# Patient Record
Sex: Female | Born: 1935 | Hispanic: No | Smoking: Former smoker
Health system: Southern US, Community
[De-identification: ages and names within clinical notes are randomized; demographics above are authoritative.]

## PROBLEM LIST (undated history)

## (undated) DIAGNOSIS — R7303 Prediabetes: Secondary | ICD-10-CM

## (undated) DIAGNOSIS — J449 Chronic obstructive pulmonary disease, unspecified: Secondary | ICD-10-CM

## (undated) DIAGNOSIS — I4891 Unspecified atrial fibrillation: Secondary | ICD-10-CM

## (undated) DIAGNOSIS — I10 Essential (primary) hypertension: Secondary | ICD-10-CM

## (undated) DIAGNOSIS — I6930 Unspecified sequelae of cerebral infarction: Secondary | ICD-10-CM

## (undated) DIAGNOSIS — E7800 Pure hypercholesterolemia, unspecified: Secondary | ICD-10-CM

## (undated) DIAGNOSIS — Z8551 Personal history of malignant neoplasm of bladder: Secondary | ICD-10-CM

---
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
## 1966-01-23 HISTORY — PX: ABDOMINAL HYSTERECTOMY: SHX81

## 1994-01-23 HISTORY — PX: BLADDER REMOVAL: SHX567

## 2006-01-23 HISTORY — PX: REPLACEMENT TOTAL KNEE: SUR1224

## 2015-01-24 HISTORY — PX: CATARACT EXTRACTION: SUR2

## 2018-10-18 ENCOUNTER — Encounter: Payer: Self-pay | Admitting: Cardiology

## 2018-10-18 ENCOUNTER — Ambulatory Visit (INDEPENDENT_AMBULATORY_CARE_PROVIDER_SITE_OTHER): Payer: Medicare Other | Admitting: Cardiology

## 2018-10-18 ENCOUNTER — Other Ambulatory Visit: Payer: Self-pay

## 2018-10-18 VITALS — BP 140/60 | HR 60 | Ht 66.5 in | Wt 223.0 lb

## 2018-10-18 DIAGNOSIS — J449 Chronic obstructive pulmonary disease, unspecified: Secondary | ICD-10-CM

## 2018-10-18 DIAGNOSIS — E7800 Pure hypercholesterolemia, unspecified: Secondary | ICD-10-CM

## 2018-10-18 DIAGNOSIS — I6930 Unspecified sequelae of cerebral infarction: Secondary | ICD-10-CM | POA: Diagnosis not present

## 2018-10-18 DIAGNOSIS — I4891 Unspecified atrial fibrillation: Secondary | ICD-10-CM

## 2018-10-18 DIAGNOSIS — I480 Paroxysmal atrial fibrillation: Secondary | ICD-10-CM

## 2018-10-18 DIAGNOSIS — I10 Essential (primary) hypertension: Secondary | ICD-10-CM

## 2018-10-18 DIAGNOSIS — R7303 Prediabetes: Secondary | ICD-10-CM

## 2018-10-18 DIAGNOSIS — Z8551 Personal history of malignant neoplasm of bladder: Secondary | ICD-10-CM

## 2018-10-18 HISTORY — DX: Unspecified sequelae of cerebral infarction: I69.30

## 2018-10-18 HISTORY — DX: Pure hypercholesterolemia, unspecified: E78.00

## 2018-10-18 HISTORY — DX: Personal history of malignant neoplasm of bladder: Z85.51

## 2018-10-18 HISTORY — DX: Unspecified atrial fibrillation: I48.91

## 2018-10-18 HISTORY — DX: Essential (primary) hypertension: I10

## 2018-10-18 HISTORY — DX: Prediabetes: R73.03

## 2018-10-18 HISTORY — DX: Chronic obstructive pulmonary disease, unspecified: J44.9

## 2018-10-18 HISTORY — DX: Paroxysmal atrial fibrillation: I48.0

## 2018-10-18 NOTE — Patient Instructions (Signed)
Medication Instructions:  Your physician recommends that you continue on your current medications as directed. Please refer to the Current Medication list given to you today.  If you need a refill on your cardiac medications before your next appointment, please call your pharmacy.   Lab work: NONE If you have labs (blood work) drawn today and your tests are completely normal, you will receive your results only by: . MyChart Message (if you have MyChart) OR . A paper copy in the mail If you have any lab test that is abnormal or we need to change your treatment, we will call you to review the results.  Testing/Procedures: You had an EKG performed today  Follow-Up: At [facility], you and your health needs are our priority.  As part of our continuing mission to provide you with exceptional heart care, we have created designated Provider Care Teams.  These Care Teams include your primary Cardiologist (physician) and Advanced Practice Providers (APPs -  Physician Assistants and Nurse Practitioners) who all work together to provide you with the care you need, when you need it. You will need a follow up appointment in 6 months.   

## 2018-10-18 NOTE — Progress Notes (Signed)
Cardiology Office Note:    Date:  10/18/2018   ID:  Lisa Rangel, DOB December 03, 1923, MRN [id number]  PCP:  Leighton Ruff, MD  Cardiologist:  Jenean Lindau, MD   Referring MD: Leighton Ruff, MD    ASSESSMENT:    1. Paroxysmal atrial fibrillation (HCC)   2. History of stroke with residual deficit   3. Hypercholesterolemia    PLAN:    In order of problems listed above:  1. Paroxysmal atrial fibrillation: EKG done today reveals sinus rhythm and nonspecific ST-T changes.  I discussed with the patient atrial fibrillation, disease process. Management and therapy including rate and rhythm control, anticoagulation benefits and potential risks were discussed extensively with the patient. Patient had multiple questions which were answered to patient's satisfaction. 2. History of stroke: Managed appropriately.  On anticoagulation now 3. Mixed dyslipidemia: Lipids followed by primary care physician. 4. Pedal edema: Patient has significant concerns about pedal edema.  This is been longstanding.  DVT study in the past has been negative.  I reviewed those records.  In view of this I told the patient about my limitations for treating pedal edema.  She is already getting a diuretic dose.  She consumes excessive fluids because of her urostomy and I told her to talk to her urologist about controlling this and reducing fluid.  Also other measures such as keeping legs elevated especially when she is sleeping and compression stockings were explained to the patient and conservative measures are probably the way to go in view of her multiple comorbidities and advanced age.  She and her daughter had multiple questions which were answered to their satisfaction.Patient will be seen in follow-up appointment in 6 months or earlier if the patient has any concerns    Medication Adjustments/Labs and Tests Ordered: Current medicines are reviewed at length with the patient today.  Concerns regarding medicines are  outlined above.  No orders of the defined types were placed in this encounter.  No orders of the defined types were placed in this encounter.    History of Present Illness:    Lisa Rangel is a 85 y.o. female who is being seen today for the evaluation of paroxysmal atrial fibrillation at the request of Leighton Ruff, MD.  Patient is a pleasant 85 year old female.  She has past medical history of essential hypertension, paroxysmal atrial fibrillation and history of stroke.  Subsequently she has been on anticoagulation.  She was on anticoagulation before this but she interrupted this by herself.  She is brought in in a wheelchair by a very supportive daughter.  Patient denies any chest pain orthopnea or PND.  She has history of bladder cancer and has a urostomy.  At the time of my evaluation, the patient is alert awake oriented and in no distress.  Past Medical History:  Diagnosis Date  . Atrial fibrillation ([place]) 10/18/2018  . COPD (chronic obstructive pulmonary disease) ([place]) 10/18/2018  . History of bladder cancer 10/18/2018  . History of stroke with residual deficit 10/18/2018  . Hypercholesterolemia 10/18/2018  . Hypertension 10/18/2018  . Prediabetes 10/18/2018    Past Surgical History:  Procedure Laterality Date  . ABDOMINAL HYSTERECTOMY  1968  . APPENDECTOMY  1949  . BLADDER REMOVAL  1996  . CATARACT EXTRACTION  2017  . REPLACEMENT TOTAL KNEE Right 2008    Current Medications: Current Meds  Medication Sig  . albuterol (VENTOLIN HFA) 108 (90 Base) MCG/ACT inhaler Inhale 1 puff into the lungs every 4 (four) hours as needed for  wheezing or shortness of breath.  Marland Kitchen alum & mag hydroxide-simeth (MAALOX/MYLANTA) 200-200-20 MG/5ML suspension Take by mouth every 6 (six) hours as needed for indigestion or heartburn.  Ailene Ards ELLIPTA 62.5-25 MCG/INH AEPB INHALE 1 PUFF BY MOUTH QD AT THE SAME TIME EACH DAY  . apixaban (ELIQUIS) 5 MG TABS tablet Take 5 mg by mouth 2 (two) times daily.  .  Ascorbic Acid (VITA-C PO) Take 500 mg by mouth.   . beta carotene 75643 UNIT capsule Take 10,000 Units by mouth daily.  Marland Kitchen ezetimibe (ZETIA) 10 MG tablet 10 mg daily.  Marland Kitchen FOLIC ACID PO Take by mouth.  . furosemide (LASIX) 80 MG tablet Take 80 mg by mouth daily.  Marland Kitchen gabapentin (NEURONTIN) 100 MG capsule 100 mg 3 (three) times daily.   Marland Kitchen MAGNESIUM-ZINC PO Take by mouth.  . metoprolol succinate (TOPROL-XL) 25 MG 24 hr tablet Take 25 mg by mouth daily.  . Omega-3 Fatty Acids (FISH OIL) 500 MG CAPS Take by mouth 3 (three) times daily.  Marland Kitchen omeprazole (PRILOSEC) 20 MG capsule 20 mg daily.  . simvastatin (ZOCOR) 20 MG tablet 20 mg daily.     Allergies:   Celecoxib, Esomeprazole, and Lisinopril   Social History   Socioeconomic History  . Marital status: Widowed    Spouse name: Not on file  . Number of children: Not on file  . Years of education: Not on file  . Highest education level: Not on file  Occupational History  . Not on file  Social Needs  . Financial resource strain: Not on file  . Food insecurity    Worry: Not on file    Inability: Not on file  . Transportation needs    Medical: Not on file    Non-medical: Not on file  Tobacco Use  . Smoking status: Former Smoker    Types: Cigarettes    Quit date: 2007    Years since quitting: 13.7  . Smokeless tobacco: Never Used  Substance and Sexual Activity  . Alcohol use: Not on file  . Drug use: Not on file  . Sexual activity: Not on file  Lifestyle  . Physical activity    Days per week: Not on file    Minutes per session: Not on file  . Stress: Not on file  Relationships  . Social Musician on phone: Not on file    Gets together: Not on file    Attends religious service: Not on file    Active member of club or organization: Not on file    Attends meetings of clubs or organizations: Not on file    Relationship status: Not on file  Other Topics Concern  . Not on file  Social History Narrative  . Not on file      Family History: The patient's family history includes Bladder Cancer in her mother; Heart attack in her father.  ROS:   Please see the history of present illness.    All other systems reviewed and are negative.  EKGs/Labs/Other Studies Reviewed:    The following studies were reviewed today: Results of the echocardiogram and medical referring doctor's notes were reviewed extensively.   Recent Labs: No results found for requested labs within last 8760 hours.  Recent Lipid Panel No results found for: CHOL, TRIG, HDL, CHOLHDL, VLDL, LDLCALC, LDLDIRECT  Physical Exam:    VS:  BP 140/60 (BP Location: Left Arm, Patient Position: Sitting, Cuff Size: Normal)   Pulse 60  Ht 5' 6.5" (1.689 m)   Wt 223 lb (101.2 kg)   SpO2 94%   BMI 35.45 kg/m     Wt Readings from Last 3 Encounters:  10/18/18 223 lb (101.2 kg)     GEN: Patient is in no acute distress HEENT: Normal NECK: No JVD; No carotid bruits LYMPHATICS: No lymphadenopathy CARDIAC: S1 S2 regular, 2/6 systolic murmur at the apex. RESPIRATORY:  Clear to auscultation without rales, wheezing or rhonchi  ABDOMEN: Soft, non-tender, non-distended MUSCULOSKELETAL: 3+ edema; No deformity  SKIN: Warm and dry NEUROLOGIC:  Alert and oriented x 3 PSYCHIATRIC:  Normal affect    Signed, Garwin Brothersajan R Abrahim Sargent, MD  10/18/2018 2:10 PM    [facility]

## 2018-12-04 DIAGNOSIS — I872 Venous insufficiency (chronic) (peripheral): Secondary | ICD-10-CM | POA: Insufficient documentation

## 2018-12-04 DIAGNOSIS — Z7901 Long term (current) use of anticoagulants: Secondary | ICD-10-CM

## 2018-12-04 DIAGNOSIS — M7989 Other specified soft tissue disorders: Secondary | ICD-10-CM

## 2018-12-04 HISTORY — DX: Venous insufficiency (chronic) (peripheral): I87.2

## 2018-12-04 HISTORY — DX: Long term (current) use of anticoagulants: Z79.01

## 2018-12-04 HISTORY — DX: Other specified soft tissue disorders: M79.89

## 2019-01-07 ENCOUNTER — Other Ambulatory Visit: Payer: Self-pay | Admitting: *Deleted

## 2019-01-07 ENCOUNTER — Telehealth: Payer: Self-pay | Admitting: Cardiology

## 2019-01-07 MED ORDER — APIXABAN 5 MG PO TABS: 5.0000 mg | ORAL_TABLET | Freq: Two times a day (BID) | ORAL | 1 refills | Status: DC

## 2019-01-07 NOTE — Telephone Encounter (Signed)
Refill sent.

## 2019-01-07 NOTE — Telephone Encounter (Signed)
°*  STAT* If patient is at the pharmacy, call can be transferred to refill team.   1. Which medications need to be refilled? (please list name of each medication and dose if known) apixaban (ELIQUIS) 5 MG TABS   2. Which pharmacy/location (including street and city if local pharmacy) is medication to be sent to?  [id number] - [place], [place] - [address] AT [facility] [address] Phone:  [phone]  Fax:  [phone]      3. Do they need a 30 day or 90 day supply?    PATIENT WILL NOT HAVE PRESCRIPTION DRUG COVERAGE UNTIL 01/24/2019, SHE ONLY WANTS ENOUGH RIGHT NOW TO GET HER THROUGH THE END OF THE YEAR

## 2019-02-13 ENCOUNTER — Other Ambulatory Visit: Payer: Self-pay

## 2019-02-13 ENCOUNTER — Encounter (HOSPITAL_COMMUNITY): Payer: Self-pay | Admitting: Emergency Medicine

## 2019-02-13 ENCOUNTER — Emergency Department (HOSPITAL_COMMUNITY)
Admission: EM | Admit: 2019-02-13 | Discharge: 2019-02-13 | Disposition: A | Discharge status: Home / Self Care | Payer: Medicare Other | Attending: Emergency Medicine | Admitting: Emergency Medicine

## 2019-02-13 ENCOUNTER — Emergency Department (HOSPITAL_COMMUNITY): Payer: Medicare Other

## 2019-02-13 DIAGNOSIS — I10 Essential (primary) hypertension: Secondary | ICD-10-CM | POA: Diagnosis not present

## 2019-02-13 DIAGNOSIS — Z87891 Personal history of nicotine dependence: Secondary | ICD-10-CM | POA: Diagnosis not present

## 2019-02-13 DIAGNOSIS — Z7901 Long term (current) use of anticoagulants: Secondary | ICD-10-CM | POA: Diagnosis not present

## 2019-02-13 DIAGNOSIS — Z79899 Other long term (current) drug therapy: Secondary | ICD-10-CM | POA: Insufficient documentation

## 2019-02-13 DIAGNOSIS — W1830XA Fall on same level, unspecified, initial encounter: Secondary | ICD-10-CM | POA: Diagnosis not present

## 2019-02-13 DIAGNOSIS — Y92019 Unspecified place in single-family (private) house as the place of occurrence of the external cause: Secondary | ICD-10-CM | POA: Insufficient documentation

## 2019-02-13 DIAGNOSIS — Y999 Unspecified external cause status: Secondary | ICD-10-CM | POA: Diagnosis not present

## 2019-02-13 DIAGNOSIS — J449 Chronic obstructive pulmonary disease, unspecified: Secondary | ICD-10-CM | POA: Diagnosis not present

## 2019-02-13 DIAGNOSIS — I4891 Unspecified atrial fibrillation: Secondary | ICD-10-CM | POA: Diagnosis not present

## 2019-02-13 DIAGNOSIS — Y929 Unspecified place or not applicable: Secondary | ICD-10-CM | POA: Insufficient documentation

## 2019-02-13 DIAGNOSIS — S0990XA Unspecified injury of head, initial encounter: Secondary | ICD-10-CM | POA: Diagnosis present

## 2019-02-13 DIAGNOSIS — Y9389 Activity, other specified: Secondary | ICD-10-CM | POA: Insufficient documentation

## 2019-02-13 DIAGNOSIS — W19XXXA Unspecified fall, initial encounter: Secondary | ICD-10-CM

## 2019-02-13 DIAGNOSIS — Y92009 Unspecified place in unspecified non-institutional (private) residence as the place of occurrence of the external cause: Secondary | ICD-10-CM

## 2019-02-13 NOTE — ED Provider Notes (Signed)
[facility] EMERGENCY DEPARTMENT Provider Note   CSN: [id number] Arrival date & time: 02/13/19  1629     History Chief Complaint  Patient presents with  . Fall    Lisa Rangel is a 86 y.o. female.  86 year old female with prior medical history as detailed below presents for evaluation following fall.  Patient reportedly was at home when she had a fall from standing.  She reportedly was reaching over to pick up a book when she lost her balance and fell.  She struck her left temple.  She did not have loss conscious.  She denies neck pain.  She denies other injury related to the fall.  She is currently taking Eliquis.  She reports full compliance with all her medications.  She uses a walker at baseline for ambulation assistance.  Patient was transported by EMS for evaluation given use of Eliquis and concurrent head injury.   Patient is currently without significant complaint.  The history is provided by the patient and medical records.  Fall This is a new problem. The current episode started 1 to 2 hours ago. The problem occurs rarely. The problem has not changed since onset.Pertinent negatives include no chest pain, no abdominal pain, no headaches and no shortness of breath. Nothing aggravates the symptoms. Nothing relieves the symptoms.       Past Medical History:  Diagnosis Date  . Atrial fibrillation (HCC) 10/18/2018  . COPD (chronic obstructive pulmonary disease) (HCC) 10/18/2018  . History of bladder cancer 10/18/2018  . History of stroke with residual deficit 10/18/2018  . Hypercholesterolemia 10/18/2018  . Hypertension 10/18/2018  . Prediabetes 10/18/2018    Patient Active Problem List   Diagnosis Date Noted  . Prediabetes 10/18/2018  . Hypercholesterolemia 10/18/2018  . COPD (chronic obstructive pulmonary disease) (HCC) 10/18/2018  . History of bladder cancer 10/18/2018  . History of stroke with residual deficit 10/18/2018  . Paroxysmal atrial  fibrillation (HCC) 10/18/2018    Past Surgical History:  Procedure Laterality Date  . ABDOMINAL HYSTERECTOMY  1968  . APPENDECTOMY  1949  . BLADDER REMOVAL  1996  . CATARACT EXTRACTION  2017  . REPLACEMENT TOTAL KNEE Right 2008     OB History   No obstetric history on file.     Family History  Problem Relation Age of Onset  . Bladder Cancer Mother   . Heart attack Father     Social History   Tobacco Use  . Smoking status: Former Smoker    Types: Cigarettes    Quit date: 2007    Years since quitting: 14.0  . Smokeless tobacco: Never Used  Substance Use Topics  . Alcohol use: Not on file  . Drug use: Not on file    Home Medications Prior to Admission medications   Medication Sig Start Date End Date Taking? Authorizing Provider  albuterol (VENTOLIN HFA) 108 (90 Base) MCG/ACT inhaler Inhale 1 puff into the lungs every 4 (four) hours as needed for wheezing or shortness of breath.    [provider]  alum & mag hydroxide-simeth (MAALOX/MYLANTA) 200-200-20 MG/5ML suspension Take by mouth every 6 (six) hours as needed for indigestion or heartburn.    [provider]  ANORO ELLIPTA 62.5-25 MCG/INH AEPB INHALE 1 PUFF BY MOUTH QD AT THE SAME TIME EACH DAY 10/01/18   [provider]  apixaban (ELIQUIS) 5 MG TABS tablet Take 1 tablet (5 mg total) by mouth 2 (two) times daily. 01/07/19   Revankar, Aundra Dubin, MD  Ascorbic Acid (VITA-C PO) Take 500 mg by mouth.     [provider]  beta carotene 95188 UNIT capsule Take 10,000 Units by mouth daily.    [provider]  ezetimibe (ZETIA) 10 MG tablet 10 mg daily. 09/13/18   [provider]  FOLIC ACID PO Take by mouth.    [provider]  furosemide (LASIX) 80 MG tablet Take 80 mg by mouth daily.    [provider]  gabapentin (NEURONTIN) 100 MG capsule 100 mg 3 (three) times daily.  10/02/18   [provider]  MAGNESIUM-ZINC PO Take by mouth.    [provider]  metoprolol succinate (TOPROL-XL) 25 MG 24 hr tablet Take 25 mg by mouth daily.    [provider]  Omega-3 Fatty Acids (FISH OIL) 500 MG CAPS Take by mouth 3 (three) times daily.    [provider]  omeprazole (PRILOSEC) 20 MG capsule 20 mg daily. 09/13/18   [provider]  simvastatin (ZOCOR) 20 MG tablet 20 mg daily. 10/02/18   [provider]    Allergies    Celecoxib, Esomeprazole, and Lisinopril  Review of Systems   Review of Systems  Respiratory: Negative for shortness of breath.   Cardiovascular: Negative for chest pain.  Gastrointestinal: Negative for abdominal pain.  Neurological: Negative for headaches.  All other systems reviewed and are negative.   Physical Exam Updated Vital Signs There were no vitals taken for this visit.  Physical Exam Vitals and nursing note reviewed.  Constitutional:      General: She is not in acute distress.    Appearance: She is well-developed.  HENT:     Head: Normocephalic and atraumatic.  Eyes:     Conjunctiva/sclera: Conjunctivae normal.     Pupils: Pupils are equal, round, and reactive to light.  Cardiovascular:     Rate and Rhythm: Normal rate and regular rhythm.     Heart sounds: Normal heart sounds.  Pulmonary:     Effort: Pulmonary effort is normal. No respiratory distress.     Breath sounds: Normal breath sounds.  Abdominal:     General: There is no distension.     Palpations: Abdomen is soft.     Tenderness: There is no abdominal tenderness.  Musculoskeletal:        General: No deformity. Normal range of motion.     Cervical back: Normal range of motion and neck supple.  Skin:    General: Skin is warm and dry.  Neurological:     General: No focal deficit present.     Mental Status: She is alert and oriented to person, place, and time. Mental status is at baseline.     Comments: GCS 15  Normal Speech  No facial droop  5/5 strength to all 4 extremities      ED  Results / Procedures / Treatments   Labs (all labs ordered are listed, but only abnormal results are displayed) Labs Reviewed - No data to display  EKG None  Radiology CT Head Wo Contrast  Result Date: 02/13/2019 CLINICAL DATA:  86 year old female with fall. EXAM: CT HEAD WITHOUT CONTRAST TECHNIQUE: Contiguous axial images were obtained from the base of the skull through the vertex without intravenous contrast. COMPARISON:  None. FINDINGS: Brain: There is mild age-related atrophy and chronic microvascular ischemic changes. Right thalamic old lacunar infarct. There is no acute intracranial hemorrhage. No mass effect or midline shift. No extra-axial fluid collection. Vascular: No hyperdense vessel or unexpected  calcification. Skull: Normal. Negative for fracture or focal lesion. Sinuses/Orbits: No acute finding. Other: None IMPRESSION: 1. No acute intracranial hemorrhage. 2. Age-related atrophy and chronic microvascular ischemic changes. Old right thalamic lacunar infarct. Electronically Signed   By: Anner Crete M.D.   On: 02/13/2019 19:11    Procedures Procedures (including critical care time)  Medications Ordered in ED Medications - No data to display  ED Course  I have reviewed the triage vital signs and the nursing notes.  Pertinent labs & imaging results that were available during my care of the patient were reviewed by me and considered in my medical decision making (see chart for details).    MDM Rules/Calculators/A&P                      MDM  Screen complete  Yenty Bloch was evaluated in Emergency Department on 02/13/2019 for the symptoms described in the history of present illness. She was evaluated in the context of the global COVID-19 pandemic, which necessitated consideration that the patient might be at risk for infection with the SARS-CoV-2 virus that causes COVID-19. Institutional protocols and algorithms that pertain to the evaluation of patients at risk for  COVID-19 are in a state of rapid change based on information released by regulatory bodies including the CDC and federal and state organizations. These policies and algorithms were followed during the patient's care in the ED.  Patient is presenting for evaluation following reported fall at home.  The cause of her fall was a loss of balance while attempting to reach for an object on the floor.  She is without evidence of significant traumatic injury on exam.  CT head does not reveal significant intracranial injury.  Patient does desire discharge home following her evaluation and treatment in the ED.  Family (daughter) who was present at the bedside is aware of the need for close follow-up.  Strict return precautions given and understood.      Final Clinical Impression(s) / ED Diagnoses Final diagnoses:  Injury of head, initial encounter  Fall in home, initial encounter    Rx / DC Orders ED Discharge Orders    None       Valarie Merino, MD 02/13/19 [place]

## 2019-02-13 NOTE — Discharge Instructions (Addendum)
Please return for any problem.  Follow-up with your regular care provider as instructed. °

## 2019-02-13 NOTE — ED Triage Notes (Signed)
BIB EMS from home after witnessed fall. Pt was bending over picking something up when she lost her balance and fell. Hit head. Takes thinners. Hx stroke with L sided deficits. VSS.

## 2019-02-21 ENCOUNTER — Encounter: Payer: Self-pay | Admitting: Podiatry

## 2019-02-21 ENCOUNTER — Ambulatory Visit: Payer: Medicare Other | Admitting: Podiatry

## 2019-02-21 ENCOUNTER — Other Ambulatory Visit: Payer: Self-pay

## 2019-02-21 VITALS — BP 163/92 | HR 60 | Temp 97.9°F | Resp 16

## 2019-02-21 DIAGNOSIS — L03031 Cellulitis of right toe: Secondary | ICD-10-CM | POA: Diagnosis not present

## 2019-02-21 DIAGNOSIS — M79675 Pain in left toe(s): Secondary | ICD-10-CM

## 2019-02-21 DIAGNOSIS — B351 Tinea unguium: Secondary | ICD-10-CM

## 2019-02-21 DIAGNOSIS — M79674 Pain in right toe(s): Secondary | ICD-10-CM | POA: Diagnosis not present

## 2019-02-21 NOTE — Progress Notes (Signed)
   Subjective:    Patient ID: Lisa Rangel, female    DOB: 1924/01/01, 86 y.o.   MRN: [id number]  HPI    Review of Systems  All other systems reviewed and are negative.      Objective:   Physical Exam        Assessment & Plan:

## 2019-02-21 NOTE — Addendum Note (Signed)
Addended by: Fritz Pickerel A on: 02/21/2019 03:43 PM   Modules accepted: Orders

## 2019-02-21 NOTE — Progress Notes (Signed)
Subjective:   Patient ID: Lisa Rangel, female   DOB: 86 y.o.   MRN: [id number]   HPI Patient presents with caregiver with patient having nail disease 1-5 bilateral and irritation of the right hallux medial side that sore and making it hard to wear shoe gear without difficulty.  They have noticed a small amount of drainage that is local to this area and patient is not completely lucid and does not smoke and is not active   Review of Systems  All other systems reviewed and are negative.       Objective:  Physical Exam Vitals and nursing note reviewed.  Constitutional:      Appearance: She is well-developed.  Pulmonary:     Effort: Pulmonary effort is normal.  Musculoskeletal:        General: Normal range of motion.  Skin:    General: Skin is warm.  Neurological:     Mental Status: She is alert.     Neurovascular status was found to be intact with patient noted to have diminishment sharp dull vibratory and range of motion.  She has an incurvated right hallux medial border with slight distal redness localized and has thick yellow brittle nailbeds 1-5 both feet that her or caregiver cannot cut.  Patient has decent digital perfusion and is not well oriented currently cannot hear well     Assessment:  Paronychia infection of the right hallux medial border localized with mycotic nail infection 1-5 both feet that are painful H&P     Plan:  He all conditions reviewed and I recommended correction of the nailbed and I infiltrated the right hallux 60 mg like Marcaine mixture sterile prep done and using sterile instrumentation I removed medial border removed proud flesh abscess tissue allow channel for drainage and I then debrided nailbeds 1-5 both feet with no iatrogenic bleeding noted

## 2019-03-12 DIAGNOSIS — H903 Sensorineural hearing loss, bilateral: Secondary | ICD-10-CM

## 2019-03-12 HISTORY — DX: Sensorineural hearing loss, bilateral: H90.3

## 2019-05-07 ENCOUNTER — Telehealth: Payer: Self-pay | Admitting: Cardiology

## 2019-05-07 NOTE — Telephone Encounter (Signed)
Called, LVM advising it was okay for daughter to accompany, patient is 86 years old, has issues with hearing, and daughter goes to all appointments with her.   Will route to make aware- if anything changed I advised we would call back.  Thank you!

## 2019-05-07 NOTE — Telephone Encounter (Signed)
New Message    Pts daughter says she will need to assist the pt to her appt because she is hard of hearing and comes to all of her appts    Please advise

## 2019-05-13 ENCOUNTER — Other Ambulatory Visit: Payer: Self-pay | Admitting: Cardiology

## 2019-05-13 NOTE — Telephone Encounter (Signed)
LOV with Dr. Tomie China 10/18/2018

## 2019-05-27 ENCOUNTER — Ambulatory Visit: Payer: Medicare Other | Admitting: Podiatry

## 2019-05-28 ENCOUNTER — Other Ambulatory Visit: Payer: Self-pay

## 2019-05-28 ENCOUNTER — Encounter: Payer: Self-pay | Admitting: Cardiology

## 2019-05-28 ENCOUNTER — Ambulatory Visit: Payer: Medicare Other | Admitting: Cardiology

## 2019-05-28 VITALS — BP 140/80 | HR 110 | Temp 97.0°F | Resp 20 | Ht 66.0 in | Wt 221.2 lb

## 2019-05-28 DIAGNOSIS — R7303 Prediabetes: Secondary | ICD-10-CM | POA: Diagnosis not present

## 2019-05-28 DIAGNOSIS — I480 Paroxysmal atrial fibrillation: Secondary | ICD-10-CM

## 2019-05-28 DIAGNOSIS — E7800 Pure hypercholesterolemia, unspecified: Secondary | ICD-10-CM | POA: Diagnosis not present

## 2019-05-28 DIAGNOSIS — I6930 Unspecified sequelae of cerebral infarction: Secondary | ICD-10-CM

## 2019-05-28 NOTE — Patient Instructions (Signed)
Medication Instructions:  No medication changes *If you need a refill on your cardiac medications before your next appointment, please call your pharmacy*   Lab Work: Your physician recommends that you have a BMET and Hgb A1C today in the office.  If you have labs (blood work) drawn today and your tests are completely normal, you will receive your results only by: Marland Kitchen MyChart Message (if you have MyChart) OR . A paper copy in the mail If you have any lab test that is abnormal or we need to change your treatment, we will call you to review the results.   Testing/Procedures: None ordered   Follow-Up: At [facility], you and your health needs are our priority.  As part of our continuing mission to provide you with exceptional heart care, we have created designated Provider Care Teams.  These Care Teams include your primary Cardiologist (physician) and Advanced Practice Providers (APPs -  Physician Assistants and Nurse Practitioners) who all work together to provide you with the care you need, when you need it.  We recommend signing up for the patient portal called "MyChart".  Sign up information is provided on this After Visit Summary.  MyChart is used to connect with patients for Virtual Visits (Telemedicine).  Patients are able to view lab/test results, encounter notes, upcoming appointments, etc.  Non-urgent messages can be sent to your provider as well.   To learn more about what you can do with MyChart, go to [email or web address].    Your next appointment:   6 month(s)  The format for your next appointment:   In Person  Provider:   Belva Crome, MD   Other Instructions NA

## 2019-05-28 NOTE — Progress Notes (Signed)
Cardiology Office Note:    Date:  05/28/2019   ID:  Lisa Rangel, DOB 1923-06-17, MRN [id number]  PCP:  Leighton Ruff, MD  Cardiologist:  Jenean Lindau, MD   Referring MD: Leighton Ruff, MD    ASSESSMENT:    1. Hypercholesterolemia   2. Paroxysmal atrial fibrillation (HCC)   3. History of stroke with residual deficit   4. Prediabetes    PLAN:    In order of problems listed above:  1. Primary prevention stressed with the patient.  Importance of compliance with diet and medication stressed and she vocalized understanding. 2. Paroxysmal atrial fibrillation:I discussed with the patient atrial fibrillation, disease process. Management and therapy including rate and rhythm control, anticoagulation benefits and potential risks were discussed extensively with the patient. Patient had multiple questions which were answered to patient's satisfaction. 3. Her hemoglobin A1c is elevated.  I suspect she is now diabetic.  Daughter requests hemoglobin A1c check.  I told him that the results need to be discussed with her primary care physician and she is agreeable. 4. History of dyslipidemia: Lipids reviewed from February and they are unremarkable. 5. Patient will be seen in follow-up appointment in 6 months or earlier if the patient has any concerns    Medication Adjustments/Labs and Tests Ordered: Current medicines are reviewed at length with the patient today.  Concerns regarding medicines are outlined above.  No orders of the defined types were placed in this encounter.  No orders of the defined types were placed in this encounter.    Chief Complaint  Patient presents with  . Follow-up    No c/o     History of Present Illness:    Lisa Rangel is a 86 y.o. female.  Patient has past medical history approximately fibrillation, essential hypertension dyslipidemia and history of bladder cancer.  Her hemoglobin A1c was greater than 7 last time.  She denies any chest pain orthopnea  or PND.  Her daughter is very supportive.  At the time of my evaluation, the patient is alert awake oriented and in no distress.  Past Medical History:  Diagnosis Date  . Anticoagulation adequate 12/04/2018  . Atrial fibrillation ([place]) 10/18/2018  . Chronic venous insufficiency 12/04/2018  . COPD (chronic obstructive pulmonary disease) ([place]) 10/18/2018  . History of bladder cancer 10/18/2018  . History of stroke with residual deficit 10/18/2018  . Hypercholesterolemia 10/18/2018  . Hypertension 10/18/2018  . Paroxysmal atrial fibrillation ([place]) 10/18/2018  . Prediabetes 10/18/2018  . Sensorineural hearing loss (SNHL) of both ears 03/12/2019   Last Assessment & Plan:  Formatting of this note might be different from the original. Concern over hearing loss. Bilateral slowly progressive hearing loss that is become very frustrating.  Monica found bilateral cerumen impaction and cleaned her ears. EXAMINATION reveals normal external canals and tympanic membranes. AUDIOGRAM shows moderate to severe sensorineural hearing loss bilaterally and sy  . Swollen leg 12/04/2018    Past Surgical History:  Procedure Laterality Date  . ABDOMINAL HYSTERECTOMY  1968  . APPENDECTOMY  1949  . BLADDER REMOVAL  1996  . CATARACT EXTRACTION  2017  . REPLACEMENT TOTAL KNEE Right 2008    Current Medications: Current Meds  Medication Sig  . acetaminophen (TYLENOL) 500 MG tablet Take 500 mg by mouth every 6 (six) hours as needed.  Marland Kitchen albuterol (VENTOLIN HFA) 108 (90 Base) MCG/ACT inhaler Inhale 1 puff into the lungs every 4 (four) hours as needed for wheezing or shortness of breath.  Marland Kitchen alum & mag  hydroxide-simeth (MAALOX/MYLANTA) 200-200-20 MG/5ML suspension Take by mouth every 6 (six) hours as needed for indigestion or heartburn.  Ailene Ards ELLIPTA 62.5-25 MCG/INH AEPB INHALE 1 PUFF BY MOUTH QD AT THE SAME TIME EACH DAY  . Ascorbic Acid (VITA-C PO) Take 500 mg by mouth.   . beta carotene 35456 UNIT capsule Take 10,000  Units by mouth daily.  . calcium citrate-vitamin D (CITRACAL+D) 315-200 MG-UNIT tablet Take 1 tablet by mouth 2 (two) times daily.  Marland Kitchen ELIQUIS 5 MG TABS tablet TAKE 1 TABLET(5 MG) BY MOUTH TWICE DAILY  . ezetimibe (ZETIA) 10 MG tablet 10 mg daily.  Marland Kitchen FOLIC ACID PO Take by mouth.  . furosemide (LASIX) 80 MG tablet Take 80 mg by mouth daily.  Marland Kitchen gabapentin (NEURONTIN) 100 MG capsule 100 mg 3 (three) times daily.   Marland Kitchen MAGNESIUM-ZINC PO Take by mouth.  . methenamine (HIPREX) 1 g tablet Take 1 g by mouth 2 (two) times daily with a meal. Antibiotic - treats bladder and kidney infections  . metoprolol tartrate (LOPRESSOR) 100 MG tablet Take 100 mg by mouth daily. Beta Blocker - treat HBP; chest pain; heart failure  . Omega-3 Fatty Acids (FISH OIL) 500 MG CAPS Take 1,000 mg by mouth 3 (three) times daily.   Marland Kitchen omeprazole (PRILOSEC) 20 MG capsule 20 mg daily.  . simvastatin (ZOCOR) 20 MG tablet 20 mg daily.  Marland Kitchen UNABLE TO FIND Antacid Suspension 200-200-20 mg/5 mL/30 mL by mouth  Take every 8 hours as needed     Allergies:   Celecoxib, Esomeprazole, and Lisinopril   Social History   Socioeconomic History  . Marital status: Widowed    Spouse name: Not on file  . Number of children: Not on file  . Years of education: Not on file  . Highest education level: Not on file  Occupational History  . Not on file  Tobacco Use  . Smoking status: Former Smoker    Types: Cigarettes    Quit date: 2007    Years since quitting: 14.3  . Smokeless tobacco: Never Used  Substance and Sexual Activity  . Alcohol use: Not on file  . Drug use: Not on file  . Sexual activity: Not on file  Other Topics Concern  . Not on file  Social History Narrative  . Not on file   Social Determinants of Health   Financial Resource Strain:   . Difficulty of Paying Living Expenses:   Food Insecurity:   . Worried About Programme researcher, broadcasting/film/video in the Last Year:   . Barista in the Last Year:   Transportation Needs:   .  Freight forwarder (Medical):   Marland Kitchen Lack of Transportation (Non-Medical):   Physical Activity:   . Days of Exercise per Week:   . Minutes of Exercise per Session:   Stress:   . Feeling of Stress :   Social Connections:   . Frequency of Communication with Friends and Family:   . Frequency of Social Gatherings with Friends and Family:   . Attends Religious Services:   . Active Member of Clubs or Organizations:   . Attends Banker Meetings:   Marland Kitchen Marital Status:      Family History: The patient's family history includes Bladder Cancer in her mother; Heart attack in her father.  ROS:   Please see the history of present illness.    All other systems reviewed and are negative.  EKGs/Labs/Other Studies Reviewed:    The following studies  were reviewed today: I discussed my findings with the patient at extensive length   Recent Labs: No results found for requested labs within last 8760 hours.  Recent Lipid Panel No results found for: CHOL, TRIG, HDL, CHOLHDL, VLDL, LDLCALC, LDLDIRECT  Physical Exam:    VS:  BP 140/80 (BP Location: Left Arm, Patient Position: Sitting, Cuff Size: Normal)   Pulse (!) 110   Temp (!) 97 F (36.1 C)   Resp 20   Ht 5\' 6"  (1.676 m)   Wt 221 lb 4 oz (100.4 kg)   SpO2 97%   BMI 35.71 kg/m     Wt Readings from Last 3 Encounters:  05/28/19 221 lb 4 oz (100.4 kg)  02/13/19 212 lb (96.2 kg)  10/18/18 223 lb (101.2 kg)     GEN: Patient is in no acute distress HEENT: Normal NECK: No JVD; No carotid bruits LYMPHATICS: No lymphadenopathy CARDIAC: Hear sounds regular, 2/6 systolic murmur at the apex. RESPIRATORY:  Clear to auscultation without rales, wheezing or rhonchi  ABDOMEN: Soft, non-tender, non-distended MUSCULOSKELETAL:  No edema; No deformity  SKIN: Warm and dry NEUROLOGIC:  Alert and oriented x 3 PSYCHIATRIC:  Normal affect   Signed, 10/20/18, MD  05/28/2019 8:50 AM    [facility]

## 2019-05-29 LAB — BASIC METABOLIC PANEL
BUN/Creatinine Ratio: 18 (ref 12–28)
BUN: 18 mg/dL (ref 10–36)
CO2: 20 mmol/L (ref 20–29)
Calcium: 10.4 mg/dL — ABNORMAL HIGH (ref 8.7–10.3)
Chloride: 102 mmol/L (ref 96–106)
Creatinine, Ser: 1 mg/dL (ref 0.57–1.00)
GFR calc Af Amer: 56 mL/min/{1.73_m2} — ABNORMAL LOW (ref >59)
GFR calc non Af Amer: 48 mL/min/{1.73_m2} — ABNORMAL LOW (ref >59)
Glucose: 145 mg/dL — ABNORMAL HIGH (ref 65–99)
Potassium: 4.2 mmol/L (ref 3.5–5.2)
Sodium: 141 mmol/L (ref 134–144)

## 2019-05-29 LAB — HEMOGLOBIN A1C
Est. average glucose Bld gHb Est-mCnc: 154 mg/dL
Hgb A1c MFr Bld: 7 % — ABNORMAL HIGH (ref 4.8–5.6)

## 2019-05-30 ENCOUNTER — Telehealth: Payer: Self-pay | Admitting: Cardiology

## 2019-05-30 NOTE — Telephone Encounter (Signed)
Patient's daughter returning call for lab results. Transferred call to the nurse.

## 2019-07-07 ENCOUNTER — Other Ambulatory Visit: Payer: Self-pay | Admitting: Cardiology

## 2019-10-02 ENCOUNTER — Other Ambulatory Visit: Payer: Self-pay | Admitting: Cardiology

## 2019-12-11 ENCOUNTER — Telehealth: Payer: Self-pay | Admitting: Physician Assistant

## 2019-12-11 NOTE — Telephone Encounter (Signed)
I connected by phone with Lisa Rangel and/or patient's caregiver on 12/11/2019 at 4:47 PM to discuss the potential vaccination through our Homebound vaccination initiative.   Prevaccination Checklist for COVID-19 Vaccines  1.  Are you feeling sick today? no  2.  Have you ever received a dose of a COVID-19 vaccine?  yes      If yes, which one? Moderna   3.  Have you ever had an allergic reaction: (This would include a severe reaction [ e.g., anaphylaxis] that required treatment with epinephrine or EpiPen or that caused you to go to the hospital.  It would also include an allergic reaction that occurred within 4 hours that caused hives, swelling, or respiratory distress, including wheezing.) A.  A previous dose of COVID-19 vaccine. no  B.  A vaccine or injectable therapy that contains multiple components, one of which is a COVID-19 vaccine component, but it is not known which component elicited the immediate reaction. no  C.  Are you allergic to polyethylene glycol? no  D. Are you allergic to Polysorbate, which is found in some vaccines, film coated tablets and intravenous steroids?  no   4.  Have you ever had an allergic reaction to another vaccine (other than COVID-19 vaccine) or an injectable medication? (This would include a severe reaction [ e.g., anaphylaxis] that required treatment with epinephrine or EpiPen or that caused you to go to the hospital.  It would also include an allergic reaction that occurred within 4 hours that caused hives, swelling, or respiratory distress, including wheezing.)  no   5.  Have you ever had a severe allergic reaction (e.g., anaphylaxis) to something other than a component of the COVID-19 vaccine, or any vaccine or injectable medication?  This would include food, pet, venom, environmental, or oral medication allergies.  no   6.  Have you received any vaccine in the last 14 days? no   7.  Have you ever had a positive test for COVID-19 or has a doctor ever told  you that you had COVID-19?  no   8.  Have you received passive antibody therapy (monoclonal antibodies or convalescent serum) as a treatment for COVID-19? no   9.  Do you have a weakened immune system caused by something such as HIV infection or cancer or do you take immunosuppressive drugs or therapies?  no   10.  Do you have a bleeding disorder or are you taking a blood thinner? yes   11.  Are you pregnant or breast-feeding? no   12.  Do you have dermal fillers? no   __________________   This patient is a 86 y.o. female that meets the FDA criteria to receive homebound vaccination. Patient or parent/caregiver understands they have the option to accept or refuse homebound vaccination.  Patient passed the pre-screening checklist and would like to proceed with homebound vaccination.  Based on questionnaire above, I recommend the patient be observed for 30 minutes.  There are an estimated 1 other household members/caregivers who are also interested in receiving the vaccine. Daughter potentially interested too.    I will send the patient's information to our scheduling team who will reach out to schedule the patient and potential caregiver/family members for homebound vaccination.    [name] 12/11/2019 4:47 PM

## 2019-12-24 ENCOUNTER — Ambulatory Visit: Payer: Medicare Other | Attending: Internal Medicine

## 2019-12-24 DIAGNOSIS — Z23 Encounter for immunization: Secondary | ICD-10-CM

## 2019-12-24 NOTE — Progress Notes (Signed)
° °  Covid-19 Vaccination Clinic  Name:  Lisa Rangel    MRN: [id number] DOB: 1923/08/05  12/24/2019  [name] was observed post Covid-19 immunization for 15 minutes without incident. She was provided with Vaccine Information Sheet and instruction to access the V-Safe system.   [name] was instructed to call 911 with any severe reactions post vaccine:  Difficulty breathing   Swelling of face and throat   A fast heartbeat   A bad rash all over body   Dizziness and weakness   Immunizations Administered    No immunizations on file.

## 2019-12-30 ENCOUNTER — Telehealth: Payer: Self-pay | Admitting: Cardiology

## 2019-12-30 NOTE — Telephone Encounter (Signed)
New Message"      Pt has a yeast infection. Daughter wants to know if it is alright for pt to use Diflucan along with pt's Eliquis?r

## 2019-12-31 NOTE — Telephone Encounter (Signed)
Advised pt's daughter to call her pharmacist as they are the best to ask for medication interactions. She verbalized understanding and had no additional questions.

## 2020-02-16 ENCOUNTER — Ambulatory Visit: Payer: Medicare Other | Admitting: Podiatry

## 2020-03-18 ENCOUNTER — Ambulatory Visit: Payer: Medicare Other | Admitting: Podiatry

## 2020-03-18 ENCOUNTER — Encounter: Payer: Self-pay | Admitting: Podiatry

## 2020-03-18 ENCOUNTER — Other Ambulatory Visit: Payer: Self-pay

## 2020-03-18 DIAGNOSIS — M79674 Pain in right toe(s): Secondary | ICD-10-CM | POA: Diagnosis not present

## 2020-03-18 DIAGNOSIS — B351 Tinea unguium: Secondary | ICD-10-CM

## 2020-03-18 DIAGNOSIS — M79675 Pain in left toe(s): Secondary | ICD-10-CM | POA: Diagnosis not present

## 2020-03-18 NOTE — Progress Notes (Signed)
Subjective:   Patient ID: Lisa Rangel, female   DOB: 87 y.o.   MRN: [id number]   HPI Patient presents stating with caregiver that her nails are so thick and impossible to cut and they have tried without relief stating that they become very tender   ROS      Objective:  Physical Exam  Neurovascular status unchanged patient in wheelchair currently with thick yellow brittle nailbeds 1-5 both feet painful     Assessment:  Mycotic nail infection 1-5 both feet chronic     Plan:  Debridement of nailbeds 1-5 both feet no iatrogenic bleeding noted

## 2020-07-21 ENCOUNTER — Other Ambulatory Visit: Payer: Self-pay | Admitting: Cardiology

## 2020-07-21 NOTE — Telephone Encounter (Signed)
30f, 100.4kg, scr 0.88 03/02/20, lovw/revankar 05/28/19 OVERDUE

## 2020-08-02 ENCOUNTER — Ambulatory Visit: Payer: Medicare Other | Attending: Critical Care Medicine

## 2020-08-02 DIAGNOSIS — Z23 Encounter for immunization: Secondary | ICD-10-CM

## 2020-08-02 NOTE — Progress Notes (Signed)
   Covid-19 Vaccination Clinic  Name:  Karis Rilling    MRN: [id number] DOB: 16-Jul-1923  08/02/2020  Ms. Keesey was observed post Covid-19 immunization for 15 minutes without incident. She was provided with Vaccine Information Sheet and instruction to access the V-Safe system.   Ms. Strathman was instructed to call 911 with any severe reactions post vaccine: Difficulty breathing  Swelling of face and throat  A fast heartbeat  A bad rash all over body  Dizziness and weakness   Immunizations Administered     Name Date Dose VIS Date Route   Moderna Covid-19 Booster Vaccine 08/02/2020 11:50 AM 0.25 mL 11/12/2019 Intramuscular   Manufacturer: Moderna   Lot: [id number]   NDC: 37342-876-81

## 2020-11-02 ENCOUNTER — Telehealth: Payer: Self-pay | Admitting: Cardiology

## 2020-11-02 NOTE — Telephone Encounter (Signed)
Patient c/o Palpitations:  High priority if patient c/o lightheadedness, shortness of breath, or chest pain  How Bernhart have you had palpitations/irregular HR/ Afib? Are you having the symptoms now?  Patient's daughter states the patient has been in afib on and off for the past week. Last week PT went to see her and notified them that she was in afib, although she was asymptomatic  Are you currently experiencing lightheadedness, SOB or CP?  No   Do you have a history of afib (atrial fibrillation) or irregular heart rhythm?  Yes   Have you checked your BP or HR? (document readings if available):  111/76  83 Per patient's daughter, yesterday HR ranged 122-187  Are you experiencing any other symptoms?  No, patient's daughter states the patient is asymptomatic

## 2020-11-02 NOTE — Telephone Encounter (Signed)
Appointment offered in [place] 10/12 but pt's daughter declined. Appointment made for 10/27 as pt has to have transportation arrangements. Pt's daughter will go to the ED or callback if changes.

## 2020-11-17 NOTE — Progress Notes (Signed)
Cardiology Office Note:    Date:  11/18/2020   ID:  Lisa Rangel, DOB 21-Jul-1923, MRN [id number]  PCP:  Lisa Perna, MD  Cardiologist:  Lisa Herrlich, MD    Referring MD: Lisa Rainier, MD    ASSESSMENT:    1. Paroxysmal atrial fibrillation (HCC)   2. Chronic anticoagulation   3. Benign hypertension with CKD (chronic kidney disease) stage III (HCC)   4. Hypercholesterolemia    PLAN:    In order of problems listed above:  Improved on higher dose beta-blocker continue the same and purchase the smart phone adapter to capture episodes of rapid heart rhythm we will give her information how to send strips through MyChart if frequent bothersome episodes low-dose amiodarone be appropriate I be hesitant to give her other drugs as I am not sure that she does not have heart failure causing her edema Continue her current anticoagulant Stable continue current treatment diuretic beta-blocker Continue current lipid-lowering treatment   Next appointment: 6 months   Medication Adjustments/Labs and Tests Ordered: Current medicines are reviewed at length with the patient today.  Concerns regarding medicines are outlined above.  Orders Placed This Encounter  Procedures   EKG 12-Lead   No orders of the defined types were placed in this encounter.   Chief Complaint  Patient presents with   Follow-up   Atrial Fibrillation    History of Present Illness:    Lisa Rangel is a 87 y.o. female with a hx of paroxysmal atrial fibrillation with anticoagulation hypertension hyperlipidemia COPD chronic edema attributed to venous insufficiency and dyslipidemia last seen by Dr. Tomie Rangel 05/28/2019.    Last EKG January 2021 showed sinus rhythm.  Echocardiogram performed [facility] 09/18/2020 showed mild concentric LVH normal ejection fraction 65 to 70% normal right ventricular size and function both atria were mildly enlarged there is mild mitral and tricuspid regurgitation and the pulmonary  artery pressure was described as mildly elevated however the recorded PA systolic of 36 was normal.  BNP level at that time was normal at 88  Compliance with diet, lifestyle and medications: Yes  Her daughter has a background in healthcare and supervises her mother's medications and care. She is present participates in evaluation decision making Her mother is having less edema and they decrease the dose of her furosemide She takes gabapentin that can cause intense sodium retention and edema and I asked him to decrease it over 2 weeks and discontinue if they can if she continues to do well and has less edema they can use the diuretic every other day They monitor her pulse with a pulse meter and she is having rapid rates greater than 1 50-1 60 presumed atrial fibrillation and they increased her beta-blocker there have been no episodes since I encouraged him to purchase the Kardia mobile adapter to capture episodes that they can send through my chart to document if she has recurrent atrial fibrillation.  If frequent and bothersome antiarrhythmic drug like amiodarone would be appropriate. Tolerates her anticoagulant without bleeding Tolerates her lipid-lowering therapy without muscle pain She is not having orthopnea shortness of breath or chest pain  She was seen [facility] room 10/14/2020 after an episode of near syncope.  Her EKG showed sinus rhythm with PVCs CT of the head showed no acute changes her high-sensitivity troponin was elevated at there is no change on delta and she was discharged from the emergency room.  Other labs included CMP with a sodium 139 potassium 3.7 creatinine  1.12 hemoglobin 11.4 and platelets 195,000 Televisit 11/01/2020 with complaints of hypotension blood pressure 88/48 and tachycardia heart rate of 160 bpm.  EMS was called and her EKG showed sinus tachycardia with PVCs. Past Medical History:  Diagnosis Date   Anticoagulation adequate 12/04/2018    Atrial fibrillation (HCC) 10/18/2018   Chronic venous insufficiency 12/04/2018   COPD (chronic obstructive pulmonary disease) (HCC) 10/18/2018   History of bladder cancer 10/18/2018   History of stroke with residual deficit 10/18/2018   Hypercholesterolemia 10/18/2018   Hypertension 10/18/2018   Paroxysmal atrial fibrillation (HCC) 10/18/2018   Prediabetes 10/18/2018   Sensorineural hearing loss (SNHL) of both ears 03/12/2019   Last Assessment & Plan:  Formatting of this note might be different from the original. Concern over hearing loss. Bilateral slowly progressive hearing loss that is become very frustrating.  Lisa Rangel found bilateral cerumen impaction and cleaned her ears. EXAMINATION reveals normal external canals and tympanic membranes. AUDIOGRAM shows moderate to severe sensorineural hearing loss bilaterally and sy   Swollen leg 12/04/2018    Past Surgical History:  Procedure Laterality Date   ABDOMINAL HYSTERECTOMY  1968   APPENDECTOMY  1949   BLADDER REMOVAL  1996   CATARACT EXTRACTION  2017   REPLACEMENT TOTAL KNEE Right 2008    Current Medications: Current Meds  Medication Sig   acetaminophen (TYLENOL) 500 MG tablet Take 500 mg by mouth every 6 (six) hours as needed.   albuterol (VENTOLIN HFA) 108 (90 Base) MCG/ACT inhaler Inhale 1 puff into the lungs every 4 (four) hours as needed for wheezing or shortness of breath.   alum & mag hydroxide-simeth (MAALOX/MYLANTA) 200-200-20 MG/5ML suspension Take by mouth every 6 (six) hours as needed for indigestion or heartburn.   ANORO ELLIPTA 62.5-25 MCG/INH AEPB INHALE 1 PUFF BY MOUTH QD AT THE SAME TIME EACH DAY   apixaban (ELIQUIS) 5 MG TABS tablet TAKE 1 TABLET(5 MG) BY MOUTH TWICE DAILY. APPOINTMENT NEEDED WITH CARDIOLOGIST FOR FURTHER REFILLS.   Ascorbic Acid (VITA-C PO) Take 500 mg by mouth.    beta carotene 93235 UNIT capsule Take 10,000 Units by mouth daily.   calcium citrate-vitamin D (CITRACAL+D) 315-200 MG-UNIT tablet Take 1  tablet by mouth 2 (two) times daily.   ezetimibe (ZETIA) 10 MG tablet 10 mg daily.   FOLIC ACID PO Take by mouth.   furosemide (LASIX) 80 MG tablet Take 80 mg by mouth daily. TAKE 0.5 TABLET (40 MG) ONCE DAILY   gabapentin (NEURONTIN) 100 MG capsule 100 mg 3 (three) times daily.    MAGNESIUM-ZINC PO Take by mouth.   methenamine (HIPREX) 1 g tablet Take 1 g by mouth 2 (two) times daily with a meal. Antibiotic - treats bladder and kidney infections   metoprolol tartrate (LOPRESSOR) 50 MG tablet Take 50 mg by mouth 2 (two) times daily.   Omega-3 Fatty Acids (FISH OIL) 500 MG CAPS Take 1,000 mg by mouth 3 (three) times daily.    omeprazole (PRILOSEC) 20 MG capsule 20 mg daily.   simvastatin (ZOCOR) 20 MG tablet 20 mg daily.   UNABLE TO FIND Antacid Suspension 200-200-20 mg/5 mL/30 mL by mouth  Take every 8 hours as needed     Allergies:   Celecoxib, Esomeprazole, and Lisinopril   Social History   Socioeconomic History   Marital status: Widowed    Spouse name: Not on file   Number of children: Not on file   Years of education: Not on file   Highest education level: Not on file  Occupational History   Not on file  Tobacco Use   Smoking status: Former    Types: Cigarettes    Quit date: 2007    Years since quitting: 15.8   Smokeless tobacco: Never  Substance and Sexual Activity   Alcohol use: Not on file   Drug use: Not on file   Sexual activity: Not on file  Other Topics Concern   Not on file  Social History Narrative   Not on file   Social Determinants of Health   Financial Resource Strain: Not on file  Food Insecurity: Not on file  Transportation Needs: Not on file  Physical Activity: Not on file  Stress: Not on file  Social Connections: Not on file     Family History: The patient's family history includes Bladder Cancer in her mother; Heart attack in her father. ROS:   Please see the history of present illness.    All other systems reviewed and are  negative.  EKGs/Labs/Other Studies Reviewed:    The following studies were reviewed today:  EKG:  EKG ordered today and personally reviewed.  The ekg ordered today demonstrates sinus rhythm nonspecific T waves   Physical Exam:    VS:  BP 134/68   Pulse (!) 59   Ht 5\' 6"  (1.676 m)   Wt 206 lb (93.4 kg)   SpO2 99%   BMI 33.25 kg/m     Wt Readings from Last 3 Encounters:  11/18/20 206 lb (93.4 kg)  05/28/19 221 lb 4 oz (100.4 kg)  02/13/19 212 lb (96.2 kg)     GEN: She looks younger than her age well nourished, well developed in no acute distress HEENT: Normal NECK: No JVD; No carotid bruits LYMPHATICS: No lymphadenopathy CARDIAC: RRR, no murmurs, rubs, gallops RESPIRATORY:  Clear to auscultation without rales, wheezing or rhonchi  ABDOMEN: Soft, non-tender, non-distended MUSCULOSKELETAL: She has marked chronic lower extremity bilateral edema; No deformity  SKIN: Warm and dry NEUROLOGIC:  Alert and oriented x 3 PSYCHIATRIC:  Normal affect    Signed, 02/15/19, MD  11/18/2020 10:22 AM    [facility]

## 2020-11-18 ENCOUNTER — Encounter: Payer: Self-pay | Admitting: Cardiology

## 2020-11-18 ENCOUNTER — Other Ambulatory Visit: Payer: Self-pay

## 2020-11-18 ENCOUNTER — Ambulatory Visit: Payer: Medicare Other | Admitting: Cardiology

## 2020-11-18 VITALS — BP 134/68 | HR 59 | Ht 66.0 in | Wt 206.0 lb

## 2020-11-18 DIAGNOSIS — N183 Chronic kidney disease, stage 3 unspecified: Secondary | ICD-10-CM

## 2020-11-18 DIAGNOSIS — Z7901 Long term (current) use of anticoagulants: Secondary | ICD-10-CM | POA: Diagnosis not present

## 2020-11-18 DIAGNOSIS — I480 Paroxysmal atrial fibrillation: Secondary | ICD-10-CM | POA: Diagnosis not present

## 2020-11-18 DIAGNOSIS — I129 Hypertensive chronic kidney disease with stage 1 through stage 4 chronic kidney disease, or unspecified chronic kidney disease: Secondary | ICD-10-CM

## 2020-11-18 DIAGNOSIS — E7800 Pure hypercholesterolemia, unspecified: Secondary | ICD-10-CM | POA: Diagnosis not present

## 2020-11-18 NOTE — Patient Instructions (Addendum)
Medication Instructions:  Your physician has recommended you make the following change in your medication:  DECREASE: Gabapentin to twice daily for one week, once daily for one week, and then stop this medication completely.  *If you need a refill on your cardiac medications before your next appointment, please call your pharmacy*   Lab Work: None If you have labs (blood work) drawn today and your tests are completely normal, you will receive your results only by: MyChart Message (if you have MyChart) OR A paper copy in the mail If you have any lab test that is abnormal or we need to change your treatment, we will call you to review the results.   Testing/Procedures: None   Follow-Up: At [facility], you and your health needs are our priority.  As part of our continuing mission to provide you with exceptional heart care, we have created designated Provider Care Teams.  These Care Teams include your primary Cardiologist (physician) and Advanced Practice Providers (APPs -  Physician Assistants and Nurse Practitioners) who all work together to provide you with the care you need, when you need it.  We recommend signing up for the patient portal called "MyChart".  Sign up information is provided on this After Visit Summary.  MyChart is used to connect with patients for Virtual Visits (Telemedicine).  Patients are able to view lab/test results, encounter notes, upcoming appointments, etc.  Non-urgent messages can be sent to your provider as well.   To learn more about what you can do with MyChart, go to [email or web address].    Your next appointment:   6 month(s)  The format for your next appointment:   In Person  Provider:   Belva Crome, MD   Other Instructions

## 2020-11-19 ENCOUNTER — Telehealth: Payer: Self-pay | Admitting: Cardiology

## 2020-11-19 NOTE — Telephone Encounter (Signed)
*  STAT* If patient is at the pharmacy, call can be transferred to refill team.   1. Which medications need to be refilled? (please list name of each medication and dose if known) Cilostazol 100mg   twice daily.   2. Which pharmacy/location (including street and city if local pharmacy) is medication to be sent to?[id number] - [place], [place] - [address]  3. Do they need a 30 day or 90 day supply? 90 day

## 2020-11-22 MED ORDER — CILOSTAZOL 100 MG PO TABS: 100.0000 mg | ORAL_TABLET | Freq: Two times a day (BID) | ORAL | 2 refills | Status: DC

## 2020-11-22 NOTE — Telephone Encounter (Signed)
Per Dr. Dulce Sellar verbally approved this request. Medication sent

## 2021-01-04 IMAGING — CT CT HEAD W/O CM
3 of 4 series · 15 of 47 positions shown, 18 images · non-contrast
Comparison: None.

CLINICAL DATA: [AGE] female with fall.

EXAM:
CT HEAD WITHOUT CONTRAST
TECHNIQUE: Contiguous axial images were obtained from the base of the skull
through the vertex without intravenous contrast.

[Series 3: head 2.0 h70h · axial · 0.45mm/px · z∈[-113,+19]mm · 9 of 84 slices shown, 12 images]
[im 9/84  brain]
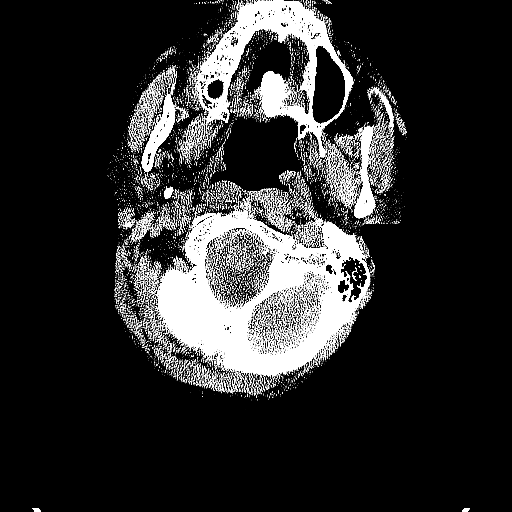
[im 9/84  bone]
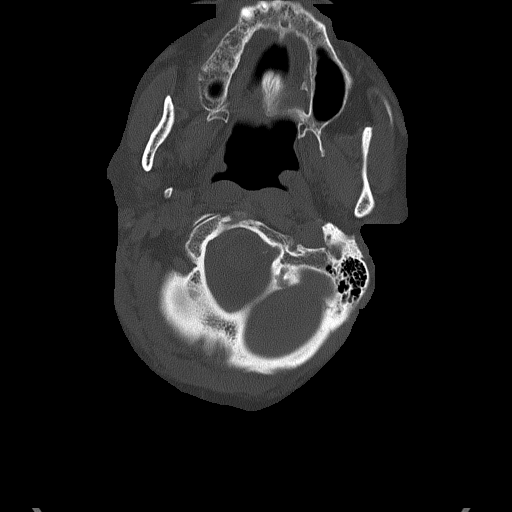
[im 17/84  brain]
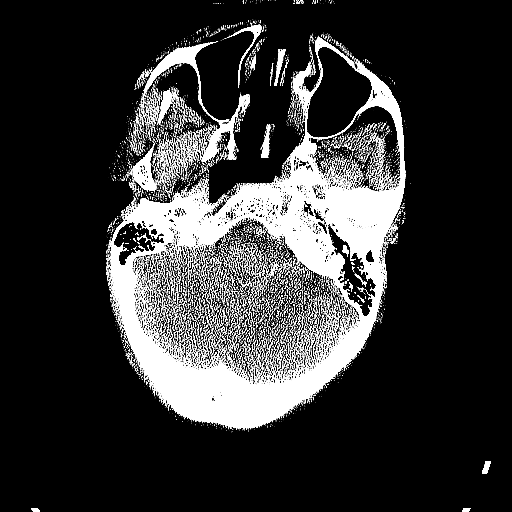
[im 25/84  brain]
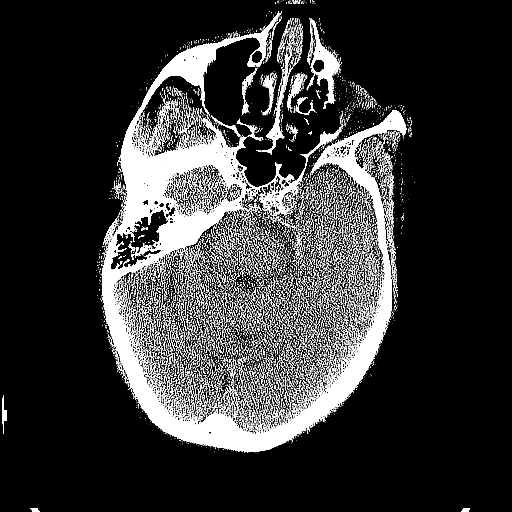
[im 34/84  brain]
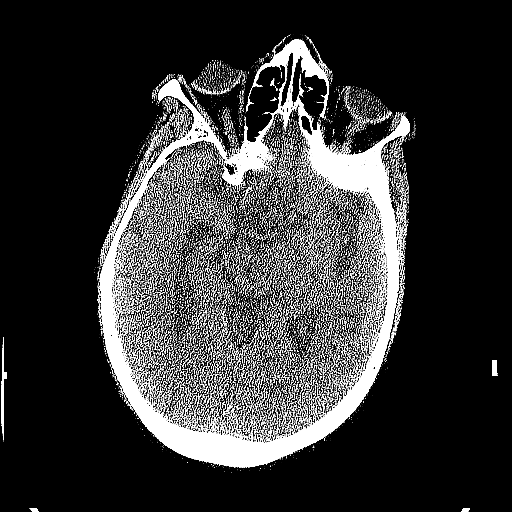
[im 42/84  brain]
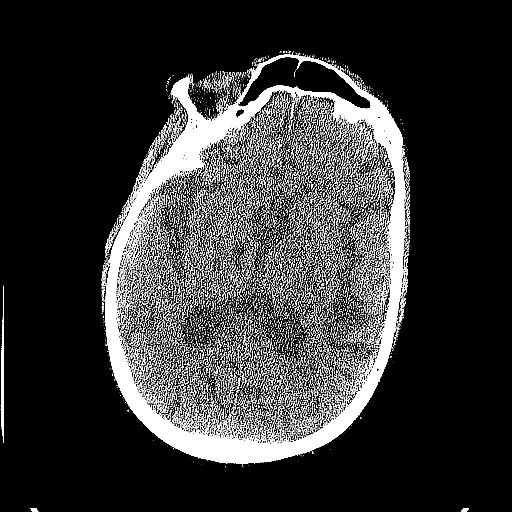
[im 42/84  bone]
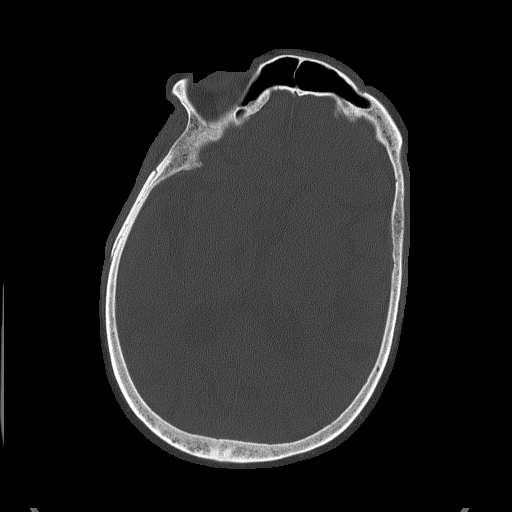
[im 50/84  brain]
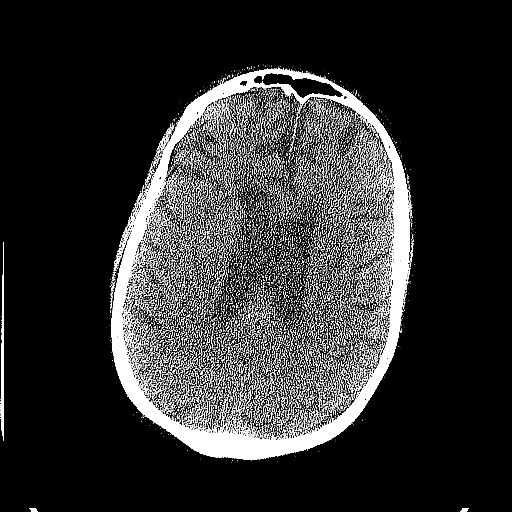
[im 59/84  brain]
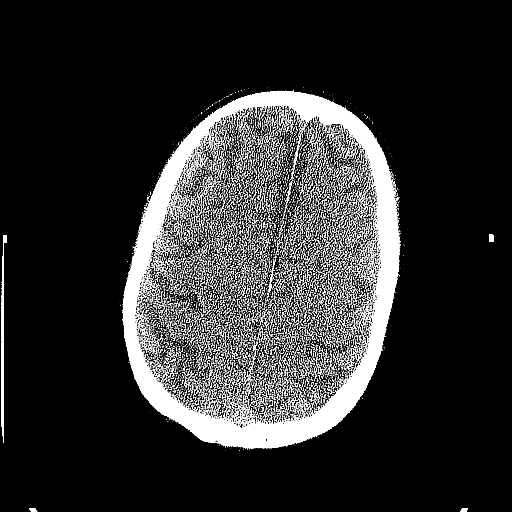
[im 67/84  brain]
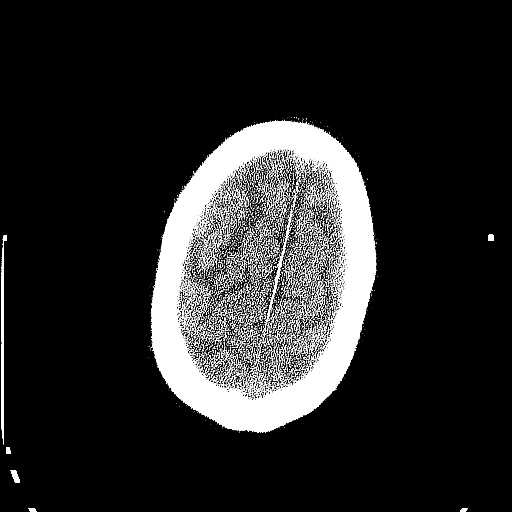
[im 75/84  brain]
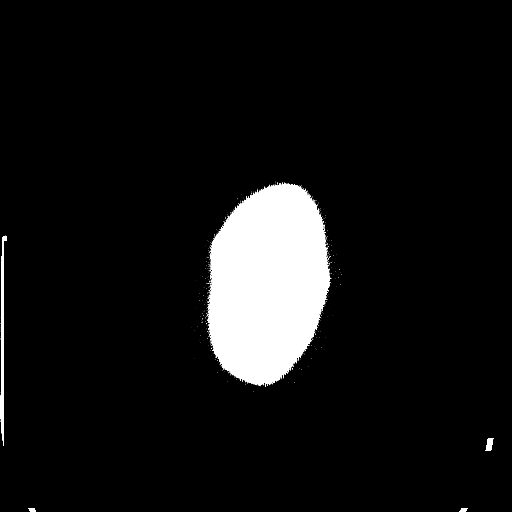
[im 75/84  bone]
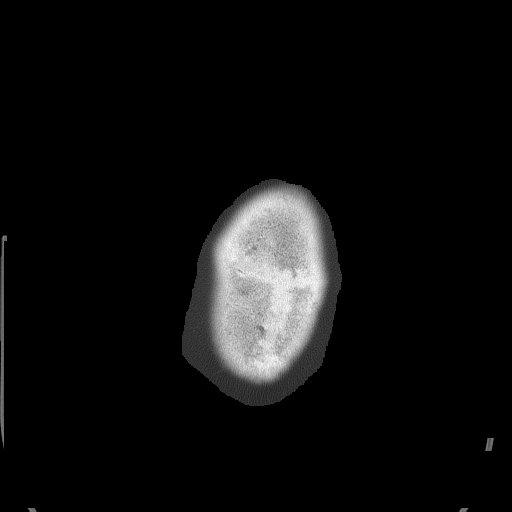

[Series 4: head 3.0 mpr cor · coronal · 0.33mm/px · 3 of 73 slices shown]
[im 25/73  brain]
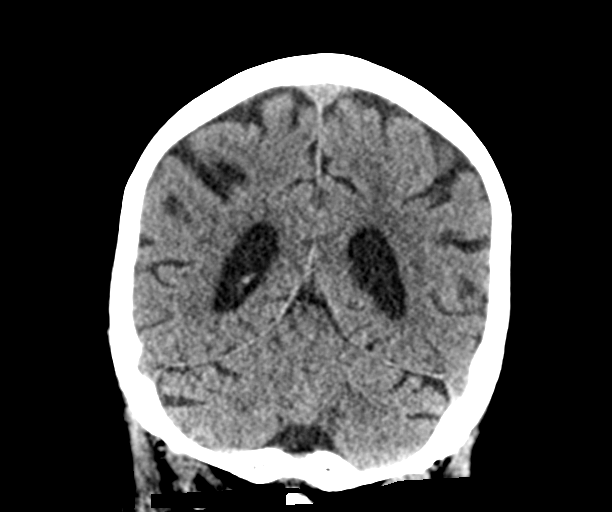
[im 33/73  brain]
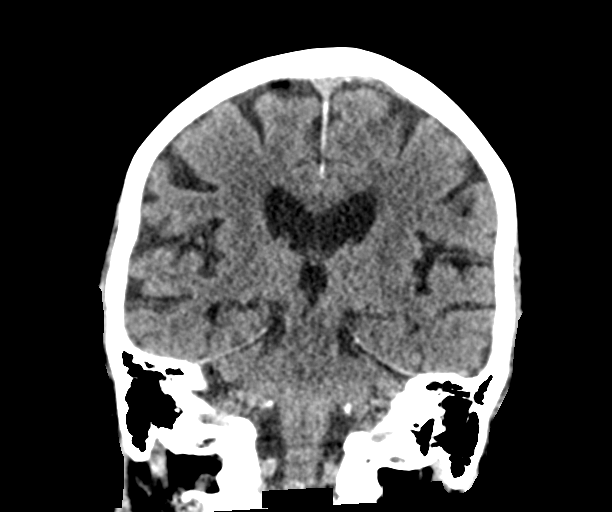
[im 41/73  brain]
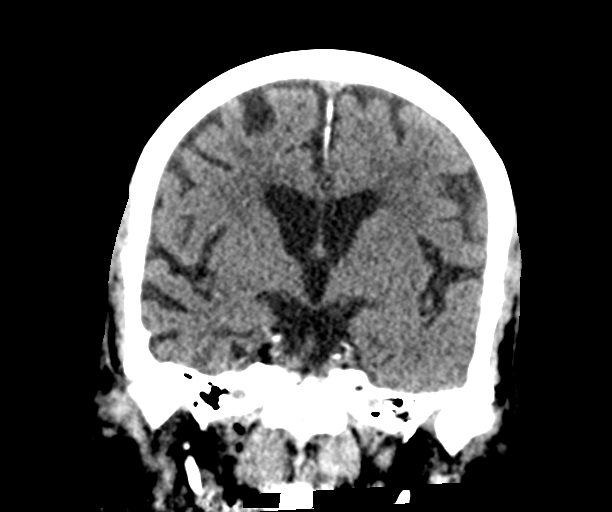

[Series 5: head 3.0 mpr sag · sagittal · 0.33mm/px · 3 of 56 slices shown]
[im 22/56  brain]
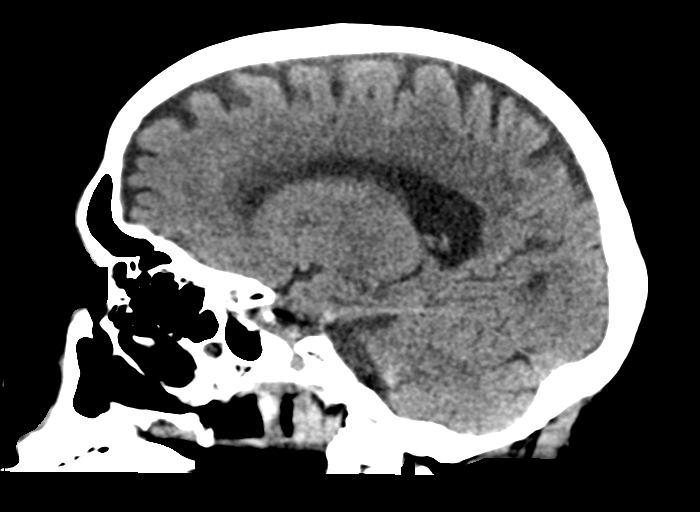
[im 28/56  brain]
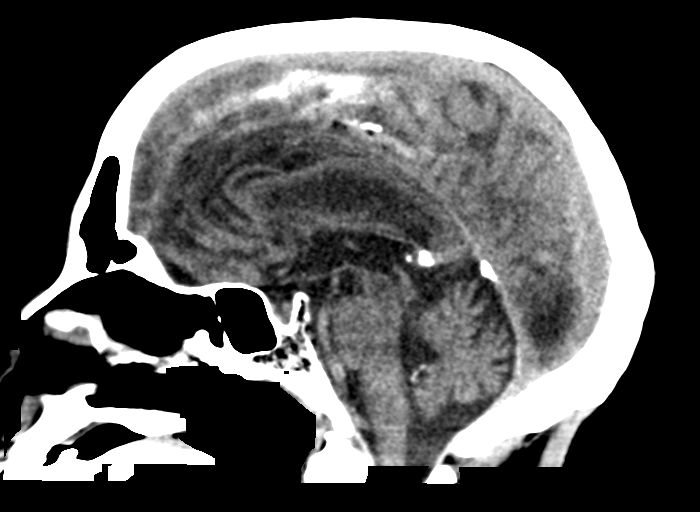
[im 34/56  brain]
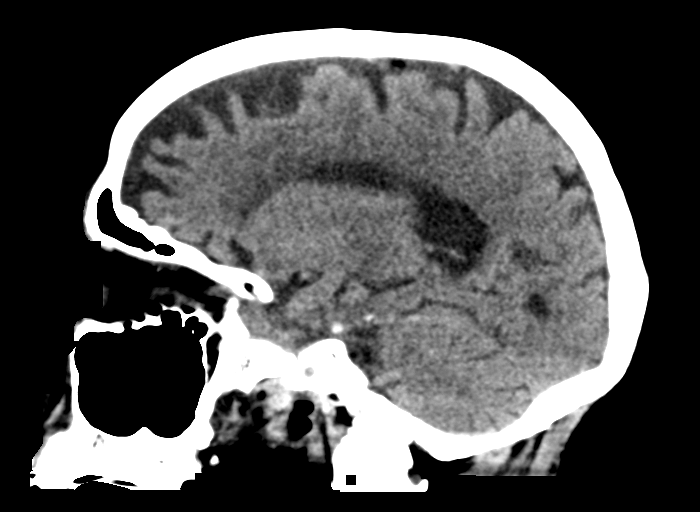

[15 of 47 positions shown; findings below may reference images not displayed]

FINDINGS: Brain: There is mild age-related atrophy and chronic microvascular
ischemic changes. Right thalamic old lacunar infarct. There is no
acute intracranial hemorrhage. No mass effect or midline shift. No
extra-axial fluid collection.

Vascular: No hyperdense vessel or unexpected calcification.

Skull: Normal. Negative for fracture or focal lesion.

Sinuses/Orbits: No acute finding.

Other: None
IMPRESSION: 1. No acute intracranial hemorrhage.
2. Age-related atrophy and chronic microvascular ischemic changes.
Old right thalamic lacunar infarct.

## 2021-02-04 ENCOUNTER — Other Ambulatory Visit: Payer: Self-pay

## 2021-02-04 ENCOUNTER — Ambulatory Visit: Payer: Medicare Other | Admitting: Podiatry

## 2021-02-04 DIAGNOSIS — M79675 Pain in left toe(s): Secondary | ICD-10-CM | POA: Diagnosis not present

## 2021-02-04 DIAGNOSIS — M79674 Pain in right toe(s): Secondary | ICD-10-CM | POA: Diagnosis not present

## 2021-02-04 DIAGNOSIS — B351 Tinea unguium: Secondary | ICD-10-CM

## 2021-02-09 ENCOUNTER — Encounter: Payer: Self-pay | Admitting: Podiatry

## 2021-02-09 NOTE — Progress Notes (Signed)
°  Subjective:  Patient ID: Lisa Rangel, female    DOB: 1923-08-10,  MRN: [id number]  Chief Complaint  Patient presents with   Plantar Fasciitis    Nail tirm    88 y.o. female returns for the above complaint.  Patient presents with thickened elongated dystrophic toenails x10.  Pain on palpation.  Patient would like to have the debrided down.  She is wheelchair-bound and unable to do it herself.  She denies any other acute complaints.  Objective:  There were no vitals filed for this visit. Podiatric Exam: Vascular: dorsalis pedis and posterior tibial pulses are palpable bilateral. Capillary return is immediate. Temperature gradient is WNL. Skin turgor WNL  Sensorium: Normal Semmes Weinstein monofilament test. Normal tactile sensation bilaterally. Nail Exam: Pt has thick disfigured discolored nails with subungual debris noted bilateral entire nail hallux through fifth toenails.  Pain on palpation to the nails. Ulcer Exam: There is no evidence of ulcer or pre-ulcerative changes or infection. Orthopedic Exam: Muscle tone and strength are WNL. No limitations in general ROM. No crepitus or effusions noted.  Skin: No Porokeratosis. No infection or ulcers    Assessment & Plan:   1. Pain due to onychomycosis of toenails of both feet     Patient was evaluated and treated and all questions answered.  Onychomycosis with pain  -Nails palliatively debrided as below. -Educated on self-care  Procedure: Nail Debridement Rationale: pain  Type of Debridement: manual, sharp debridement. Instrumentation: Nail nipper, rotary burr. Number of Nails: 10  Procedures and Treatment: Consent by patient was obtained for treatment procedures. The patient understood the discussion of treatment and procedures well. All questions were answered thoroughly reviewed. Debridement of mycotic and hypertrophic toenails, 1 through 5 bilateral and clearing of subungual debris. No ulceration, no infection noted.  Return  Visit-Office Procedure: Patient instructed to return to the office for a follow up visit 3 months for continued evaluation and treatment.  Nicholes Rough, DPM    Return in about 3 months (around 05/05/2021).

## 2021-03-05 ENCOUNTER — Other Ambulatory Visit: Payer: Self-pay | Admitting: Cardiology

## 2021-05-09 ENCOUNTER — Ambulatory Visit: Payer: Medicare Other | Admitting: Podiatry

## 2021-05-23 ENCOUNTER — Ambulatory Visit: Payer: Medicare Other | Admitting: Cardiology
